# Patient Record
Sex: Female | Born: 1970 | Race: White | Hispanic: No | Marital: Married | Smoking: Never smoker
Health system: Southern US, Community
[De-identification: ages and names within clinical notes are randomized; demographics above are authoritative.]

## PROBLEM LIST (undated history)

## (undated) DIAGNOSIS — E282 Polycystic ovarian syndrome: Secondary | ICD-10-CM

## (undated) DIAGNOSIS — E079 Disorder of thyroid, unspecified: Secondary | ICD-10-CM

## (undated) HISTORY — PX: OTHER SURGICAL HISTORY: SHX169

---

## 2018-10-08 ENCOUNTER — Emergency Department (HOSPITAL_COMMUNITY): Payer: Self-pay

## 2018-10-08 ENCOUNTER — Ambulatory Visit (HOSPITAL_COMMUNITY)
Admission: EM | Admit: 2018-10-08 | Discharge: 2018-10-08 | Disposition: A | Discharge status: Home / Self Care | Payer: Self-pay

## 2018-10-08 ENCOUNTER — Encounter (HOSPITAL_COMMUNITY): Payer: Self-pay | Admitting: Emergency Medicine

## 2018-10-08 ENCOUNTER — Emergency Department (HOSPITAL_COMMUNITY)
Admission: EM | Admit: 2018-10-08 | Discharge: 2018-10-09 | Disposition: A | Discharge status: Home / Self Care | Payer: Self-pay | Attending: Emergency Medicine | Admitting: Emergency Medicine

## 2018-10-08 DIAGNOSIS — R1031 Right lower quadrant pain: Secondary | ICD-10-CM | POA: Insufficient documentation

## 2018-10-08 DIAGNOSIS — R1013 Epigastric pain: Secondary | ICD-10-CM | POA: Insufficient documentation

## 2018-10-08 DIAGNOSIS — R1030 Lower abdominal pain, unspecified: Secondary | ICD-10-CM

## 2018-10-08 HISTORY — DX: Polycystic ovarian syndrome: E28.2

## 2018-10-08 HISTORY — DX: Disorder of thyroid, unspecified: E07.9

## 2018-10-08 LAB — URINALYSIS, ROUTINE W REFLEX MICROSCOPIC
Bacteria, UA: NONE SEEN
Bilirubin Urine: NEGATIVE
Glucose, UA: NEGATIVE mg/dL
Ketones, ur: NEGATIVE mg/dL
Leukocytes, UA: NEGATIVE
Nitrite: NEGATIVE
Protein, ur: NEGATIVE mg/dL
Specific Gravity, Urine: 1.025 (ref 1.005–1.030)
pH: 5 (ref 5.0–8.0)

## 2018-10-08 LAB — CBC
HCT: 45.1 % (ref 36.0–46.0)
Hemoglobin: 14.4 g/dL (ref 12.0–15.0)
MCH: 27.8 pg (ref 26.0–34.0)
MCHC: 31.9 g/dL (ref 30.0–36.0)
MCV: 87.1 fL (ref 80.0–100.0)
Platelets: 342 10*3/uL (ref 150–400)
RBC: 5.18 MIL/uL — ABNORMAL HIGH (ref 3.87–5.11)
RDW: 14.1 % (ref 11.5–15.5)
WBC: 12.9 10*3/uL — ABNORMAL HIGH (ref 4.0–10.5)
nRBC: 0 % (ref 0.0–0.2)

## 2018-10-08 LAB — COMPREHENSIVE METABOLIC PANEL
ALT: 51 U/L — ABNORMAL HIGH (ref 0–44)
AST: 22 U/L (ref 15–41)
Albumin: 4.2 g/dL (ref 3.5–5.0)
Alkaline Phosphatase: 91 U/L (ref 38–126)
Anion gap: 11 (ref 5–15)
BUN: 13 mg/dL (ref 6–20)
CO2: 22 mmol/L (ref 22–32)
Calcium: 8.7 mg/dL — ABNORMAL LOW (ref 8.9–10.3)
Chloride: 98 mmol/L (ref 98–111)
Creatinine, Ser: 0.7 mg/dL (ref 0.44–1.00)
GFR calc Af Amer: >60 mL/min (ref >60)
GFR calc non Af Amer: >60 mL/min (ref >60)
Glucose, Bld: 117 mg/dL — ABNORMAL HIGH (ref 70–99)
Potassium: 3.4 mmol/L — ABNORMAL LOW (ref 3.5–5.1)
Sodium: 131 mmol/L — ABNORMAL LOW (ref 135–145)
Total Bilirubin: 0.7 mg/dL (ref 0.3–1.2)
Total Protein: 7.6 g/dL (ref 6.5–8.1)

## 2018-10-08 LAB — LIPASE, BLOOD: Lipase: 37 U/L (ref 11–51)

## 2018-10-08 LAB — I-STAT BETA HCG BLOOD, ED (MC, WL, AP ONLY): I-stat hCG, quantitative: <5 m[IU]/mL (ref <5)

## 2018-10-08 MED ORDER — SODIUM CHLORIDE 0.9 % IV BOLUS
1000.0000 mL | Freq: Once | INTRAVENOUS | Status: AC
  Administered 2018-10-08: 1000 mL via INTRAVENOUS

## 2018-10-08 MED ORDER — DICYCLOMINE HCL 20 MG PO TABS: 20.0000 mg | ORAL_TABLET | Freq: Three times a day (TID) | ORAL | 0 refills | Status: AC

## 2018-10-08 MED ORDER — ONDANSETRON HCL 4 MG/2ML IJ SOLN
4.0000 mg | Freq: Once | INTRAMUSCULAR | Status: AC
  Administered 2018-10-08: 4 mg via INTRAVENOUS
  Filled 2018-10-08: qty 2

## 2018-10-08 MED ORDER — ACETAMINOPHEN 325 MG PO TABS
650.0000 mg | ORAL_TABLET | Freq: Once | ORAL | Status: AC
  Administered 2018-10-08: 650 mg via ORAL
  Filled 2018-10-08: qty 2

## 2018-10-08 MED ORDER — FENTANYL CITRATE (PF) 100 MCG/2ML IJ SOLN
50.0000 ug | Freq: Once | INTRAMUSCULAR | Status: AC
  Administered 2018-10-08: 50 ug via INTRAVENOUS
  Filled 2018-10-08: qty 2

## 2018-10-08 MED ORDER — IOHEXOL 300 MG/ML  SOLN
125.0000 mL | Freq: Once | INTRAMUSCULAR | Status: AC | PRN
  Administered 2018-10-08: 125 mL via INTRAVENOUS

## 2018-10-08 MED ORDER — MORPHINE SULFATE (PF) 2 MG/ML IV SOLN
2.0000 mg | Freq: Once | INTRAVENOUS | Status: AC
  Administered 2018-10-08: 2 mg via INTRAVENOUS
  Filled 2018-10-08: qty 1

## 2018-10-08 NOTE — ED Notes (Signed)
THE PT WAS SITTING IN THE TOILET REPORTING THAT SHE HAD BLOODY DIARRHEA  SHE WAS LYING IN THE FLOOR IN THE BR  SHE REPORTS THAT SHE WANTS TO LIE IN THE FLOOR  NO TAKEN TO A FLAT BENCH ANOTHER PTS FAMILY  SAW HER LYING IN THE BR FLOOR AND CALLED [place]  SHE LAY DOWN IN THE FLOOR  SHE DID NOT FALL

## 2018-10-08 NOTE — ED Triage Notes (Signed)
Pt presents to ED for assessment of generalized abdominal pain after her second dose of medrol and valtrex.  C/o constant nausea 330, and increasing severe pain since 530.  Has tried two doses of zofran and 1 dose of phenergan at home.  Patient also c/o diarrhea.  Patient states stool was bloody.  States emesis has had blood streaks.

## 2018-10-08 NOTE — Discharge Instructions (Signed)
Your CT scan shows no acute etiology to your pain today. There was sludge and stones seen in your gallbladder, but no associated inflammation. You may follow-up with a GI specialist about this for further evaluation. I have prescribed Bentyl to assist with your abdominal pain.  Thank you for allowing [place] to take care of you tonight.

## 2018-10-08 NOTE — ED Notes (Signed)
Results reviewed,  No changes in acuity at this time.  Patient located in the lobby, still in pain, but resting more comfortably.

## 2018-10-08 NOTE — ED Provider Notes (Signed)
[facility] EMERGENCY DEPARTMENT Provider Note  CSN: [id number] Arrival date & time: 10/08/18  1828   History   Chief Complaint Chief Complaint  Patient presents with  . Abdominal Pain    HPI Melinda Gibson is a 47 y.o. female with a medical history of PCOS and hypothyroidism who presented to the ED for   Abdominal Pain   This is a new problem. The current episode started 6 to 12 hours ago. The problem has not changed since onset.Associated with: medication use (Medrol and Valtrex) The pain is located in the epigastric region. The quality of the pain is cramping and colicky. The pain is severe. Associated symptoms include diarrhea and nausea. Pertinent negatives include fever, hematochezia, melena, vomiting, constipation, dysuria, frequency and hematuria. The symptoms are aggravated by certain positions. The symptoms are relieved by recumbency. Past workup does not include surgery. Her past medical history does not include PUD, gallstones, GERD, ulcerative colitis, Crohn's disease or irritable bowel syndrome.   Past Medical History:  Diagnosis Date  . PCOS (polycystic ovarian syndrome)   . Thyroid disease    hypo    There are no active problems to display for this patient.   Past Surgical History:  Procedure Laterality Date  . rotator cuff       OB History   None      Home Medications    Prior to Admission medications   Not on File    Family History History reviewed. No pertinent family history.  Social History Social History   Tobacco Use  . Smoking status: Never Smoker  . Smokeless tobacco: Never Used  Substance Use Topics  . Alcohol use: Not Currently  . Drug use: Never     Allergies   Imitrex [sumatriptan]   Review of Systems Review of Systems  Constitutional: Negative for fever.  Gastrointestinal: Positive for abdominal pain, diarrhea and nausea. Negative for constipation, hematochezia, melena and vomiting.  Genitourinary:  Negative for dysuria, frequency and hematuria.  All other systems reviewed and are negative.    Physical Exam Updated Vital Signs BP (!) 151/94   Pulse 80   Temp 99.3 F (37.4 C) (Oral)   Resp 18   Ht 5\' 7"  (1.702 m)   Wt 110.2 kg   LMP 09/22/2018   SpO2 100%   BMI 38.06 kg/m   Physical Exam  Constitutional: She appears well-developed and well-nourished.  Writhing on bed. Attempting to get comfortable.  HENT:  Mouth/Throat: Oropharynx is clear and moist.  Cardiovascular: Normal rate, regular rhythm and normal heart sounds.  Pulmonary/Chest: Effort normal and breath sounds normal.  Abdominal: Soft. Normal appearance and bowel sounds are normal. She exhibits no distension. There is tenderness in the right lower quadrant and epigastric area. There is rebound. There is no rigidity, no guarding, no tenderness at McBurney's point and negative Murphy's sign.  Neurological: She is alert.  Skin: Skin is warm. Capillary refill takes less than 2 seconds. She is not diaphoretic. No pallor.  Psychiatric: She has a normal mood and affect. Her speech is normal.  Nursing note and vitals reviewed.  ED Treatments / Results  Labs (all labs ordered are listed, but only abnormal results are displayed) Labs Reviewed  COMPREHENSIVE METABOLIC PANEL - Abnormal; Notable for the following components:      Result Value   Sodium 131 (*)    Potassium 3.4 (*)    Glucose, Bld 117 (*)    Calcium 8.7 (*)    ALT 51 (*)  All other components within normal limits  CBC - Abnormal; Notable for the following components:   WBC 12.9 (*)    RBC 5.18 (*)    All other components within normal limits  URINALYSIS, ROUTINE W REFLEX MICROSCOPIC - Abnormal; Notable for the following components:   Hgb urine dipstick SMALL (*)    All other components within normal limits  LIPASE, BLOOD  I-STAT BETA HCG BLOOD, ED ([facility], [facility], [facility] ONLY)    EKG None  Radiology No results found.  Procedures Procedures (including  critical care time)  Medications Ordered in ED Medications  fentaNYL (SUBLIMAZE) injection 50 mcg (50 mcg Intravenous Given 10/08/18 1909)  sodium chloride 0.9 % bolus 1,000 mL (0 mLs Intravenous Stopped 10/08/18 2143)  ondansetron (ZOFRAN) injection 4 mg (4 mg Intravenous Given 10/08/18 2040)  iohexol (OMNIPAQUE) 300 MG/ML solution 125 mL (125 mLs Intravenous Contrast Given 10/08/18 2141)     Initial Impression / Assessment and Plan / ED Course  Triage vital signs and the nursing notes have been reviewed.  Pertinent labs & imaging results that were available during care of the patient were reviewed and considered in medical decision making (see chart for details).  Patient presents afebrile with complaints of acute onset abdominal pain x4 hours. History and physical exam consistent with an acute abdomen possibly appendicitis as most of patient's pain is RLQ. Will order abdominal labs in addition to CT abdomen/pelvis for further evaluation.  Clinical Course as of Oct 08 2258  Sat Oct 08, 2018  2206 Mildly elevated WBC and ALT. Remaining labs unremarkable. UA unremarkable.   [GM]  2255 CT abdomen shows no acute intra-abdominal or GU etiology to patient's complaints. Stones and sludge seen in the gallbladder. No biliary obstruction per labs.   [GM]    Clinical Course User Index [GM] Krisha Beegle, Sharyon Medicus, PA-C   Final Clinical Impressions(s) / ED Diagnoses  1. Abdominal Pain. Sludge and stones seen in gallbladder. Referral to GI for follow-up. Rx for Bentyl for abdominal cramping.  Dispo: Home. After thorough clinical evaluation, this patient is determined to be medically stable and can be safely discharged with the previously mentioned treatment and/or outpatient follow-up/referral(s). At this time, there are no other apparent medical conditions that require further screening, evaluation or treatment.  Final diagnoses:  Lower abdominal pain    ED Discharge Orders         Ordered     dicyclomine (BENTYL) 20 MG tablet  3 times daily before meals & bedtime     10/08/18 2302            Reva Bores 10/08/18 2309    Raeford Razor, MD 10/09/18 2351

## 2018-10-08 NOTE — ED Notes (Signed)
BROUGHT FROM [facility] FOR TREATMENT

## 2018-10-08 NOTE — ED Notes (Signed)
Patient has "the worst abdominal pain she has had"  Diarrhea and vomiting since this morning .  Reports 10 diarrhea stools, last three with bright blood.  No blood in vomitus.  Patient is rolling in chair.  Spoke to [employer], np about patient.  Encouraged patient to go to ed for evaluation.  Will see here, but chances are, patient will be sent to ed for further evaluation.  Patient is writhing in chair in intake room. Toni Amend, rn transferred patient by wheelchair to ed.

## 2018-10-09 MED ORDER — PROMETHAZINE HCL 25 MG PO TABS: 25.0000 mg | ORAL_TABLET | Freq: Four times a day (QID) | ORAL | 0 refills | Status: AC | PRN

## 2018-10-09 NOTE — ED Notes (Signed)
Patient verbalizes understanding of discharge instructions. Opportunity for questioning and answers were provided. Armband removed by staff, pt discharged from ED ambulatory w/ husband  

## 2019-06-15 IMAGING — CT CT ABD-PELV W/ CM
1 series · 1 of 1 positions shown · IV contrast (omnipaque)
Comparison: None.

CLINICAL DATA: Periumbilical abdominal pain.  Blood in stool.

EXAM:
CT ABDOMEN AND PELVIS WITH CONTRAST
TECHNIQUE: Multidetector CT imaging of the abdomen and pelvis was performed
using the standard protocol following bolus administration of
intravenous contrast.
CONTRAST:  125mL OMNIPAQUE IOHEXOL 300 MG/ML  SOLN

[Series 1: topogram 0.6 tr20 · sagittal · 1.00mm/px · 1 of 1 slices shown]
[im 1/1]
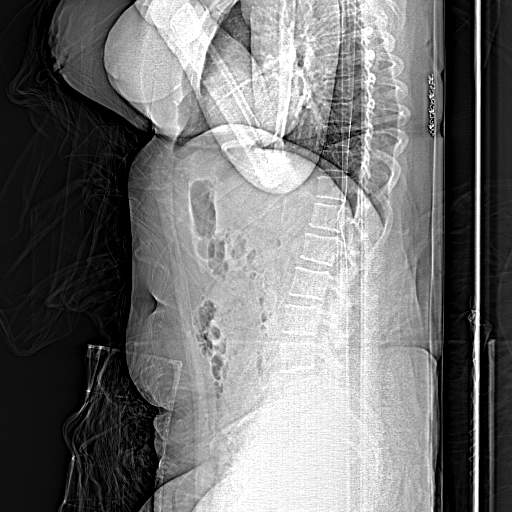

[1 of 1 positions shown; findings below may reference images not displayed]

FINDINGS: Lower chest: No acute abnormality.

Hepatobiliary: Small low-attenuation lesion in the left hepatic lobe
on series 3, image 27 is probably benign, possibly a small cyst or
hemangioma measuring 5.8 mm. A mass in the right hepatic lobe on
series 3, image 28, measures 18 mm with peripheral discontinuous
nodular enhancement. The mass partially fills in on delayed imaging.
The finding is most consistent with a hemangioma. No other hepatic
masses. The portal vein is patent. There are stones and sludge in
the gallbladder without wall thickening.

Pancreas: Unremarkable. No pancreatic ductal dilatation or
surrounding inflammatory changes.

Spleen: Normal in size without focal abnormality.

Adrenals/Urinary Tract: Probable tiny cysts in the kidneys, too
small to characterize. No suspicious masses or stones. No
hydronephrosis or perinephric stranding. The ureters and bladder are
normal.

Stomach/Bowel: The stomach and small bowel are normal. The colon is
normal. Visualized portions of the appendix are normal.

Vascular/Lymphatic: No significant vascular findings are present. No
enlarged abdominal or pelvic lymph nodes.

Reproductive: Uterus and bilateral adnexa are unremarkable.

Other: Fat containing umbilical hernia.

Musculoskeletal: No acute or significant osseous findings.
IMPRESSION: 1. Stones and sludge in the gallbladder without wall thickening or
pericholecystic fluid.
2. No cause for the patient's acute symptoms identified.
3. Right hepatic lobe hemangioma.
4. Fat containing umbilical hernia.
